# Patient Record
Sex: Male | Born: 1980 | Race: White | Hispanic: No | Marital: Single | Smoking: Former smoker
Health system: Southern US, Community
[De-identification: ages and names within clinical notes are randomized; demographics above are authoritative.]

## PROBLEM LIST (undated history)

## (undated) DIAGNOSIS — Z8619 Personal history of other infectious and parasitic diseases: Secondary | ICD-10-CM

## (undated) HISTORY — DX: Personal history of other infectious and parasitic diseases: Z86.19

---

## 2017-12-26 ENCOUNTER — Ambulatory Visit: Payer: Self-pay | Admitting: Internal Medicine

## 2017-12-26 ENCOUNTER — Encounter: Payer: Self-pay | Admitting: Internal Medicine

## 2017-12-26 VITALS — BP 126/84 | HR 56 | Temp 98.2°F | Ht 71.5 in | Wt 185.0 lb

## 2017-12-26 DIAGNOSIS — Z0000 Encounter for general adult medical examination without abnormal findings: Secondary | ICD-10-CM

## 2017-12-26 DIAGNOSIS — Z113 Encounter for screening for infections with a predominantly sexual mode of transmission: Secondary | ICD-10-CM

## 2017-12-26 LAB — COMPREHENSIVE METABOLIC PANEL
ALBUMIN: 5 g/dL (ref 3.5–5.2)
ALK PHOS: 65 U/L (ref 39–117)
ALT: 21 U/L (ref 0–53)
AST: 20 U/L (ref 0–37)
BUN: 15 mg/dL (ref 6–23)
CO2: 31 mEq/L (ref 19–32)
CREATININE: 0.82 mg/dL (ref 0.40–1.50)
Calcium: 9.9 mg/dL (ref 8.4–10.5)
Chloride: 102 mEq/L (ref 96–112)
GFR: 113.34 mL/min (ref >60.00)
Glucose, Bld: 102 mg/dL — ABNORMAL HIGH (ref 70–99)
POTASSIUM: 4.4 meq/L (ref 3.5–5.1)
SODIUM: 138 meq/L (ref 135–145)
TOTAL PROTEIN: 7.4 g/dL (ref 6.0–8.3)
Total Bilirubin: 0.4 mg/dL (ref 0.2–1.2)

## 2017-12-26 LAB — CBC
HEMATOCRIT: 43.7 % (ref 39.0–52.0)
Hemoglobin: 15.2 g/dL (ref 13.0–17.0)
MCHC: 34.8 g/dL (ref 30.0–36.0)
MCV: 91.5 fl (ref 78.0–100.0)
PLATELETS: 214 10*3/uL (ref 150.0–400.0)
RBC: 4.78 Mil/uL (ref 4.22–5.81)
RDW: 12.5 % (ref 11.5–15.5)
WBC: 5.8 10*3/uL (ref 4.0–10.5)

## 2017-12-26 LAB — LIPID PANEL
CHOLESTEROL: 139 mg/dL (ref 0–200)
HDL: 57.4 mg/dL (ref >39.00)
LDL Cholesterol: 71 mg/dL (ref 0–99)
NonHDL: 81.95
Total CHOL/HDL Ratio: 2
Triglycerides: 54 mg/dL (ref 0.0–149.0)
VLDL: 10.8 mg/dL (ref 0.0–40.0)

## 2017-12-26 NOTE — Patient Instructions (Signed)

## 2017-12-26 NOTE — Progress Notes (Signed)
HPI  Pt presents to the clinic today to establish care. He has not had a PCP in many years.  Flu: never Tetanus: > 10 years ago Dentist: as needed, scheduled in 1 month  Diet: He eats chicken and fish. He consumes fruits and veggies daily. He rarely eats fried foods. He drinks mostly water. Exercise: Cycles 5 days per week  Past Medical History:  Diagnosis Date  . History of chicken pox     Current Outpatient Medications  Medication Sig Dispense Refill  . b complex vitamins tablet Take 1 tablet by mouth daily.    . Creatine POWD Take by mouth.    . Glucosamine 500 MG CAPS Take by mouth.    . Multiple Vitamin (MULTIVITAMIN) tablet Take 1 tablet by mouth daily.    Marland Kitchen. OVER THE COUNTER MEDICATION Take 2 capsules by mouth 2 (two) times daily. Testosterboost    . Protein POWD Take by mouth.     No current facility-administered medications for this visit.     Allergies  Allergen Reactions  . Erythromycin Other (See Comments)    Does not recall was a baby    Family History  Problem Relation Age of Onset  . Supraventricular tachycardia Mother   . Depression Mother   . Heart Problems Father   . Heart disease Father   . Depression Father   . Heart disease Paternal Uncle   . Heart disease Paternal Grandfather   . Heart disease Paternal Uncle   . Heart disease Paternal Uncle   . Heart disease Paternal Uncle     Social History   Socioeconomic History  . Marital status: Single    Spouse name: Not on file  . Number of children: Not on file  . Years of education: Not on file  . Highest education level: Not on file  Occupational History  . Not on file  Social Needs  . Financial resource strain: Not on file  . Food insecurity:    Worry: Not on file    Inability: Not on file  . Transportation needs:    Medical: Not on file    Non-medical: Not on file  Tobacco Use  . Smoking status: Former Smoker    Types: Cigarettes  . Smokeless tobacco: Never Used  . Tobacco comment:  quit 2010  Substance and Sexual Activity  . Alcohol use: Yes    Comment: nightly wine  . Drug use: Yes    Types: Marijuana  . Sexual activity: Not on file  Lifestyle  . Physical activity:    Days per week: Not on file    Minutes per session: Not on file  . Stress: Not on file  Relationships  . Social connections:    Talks on phone: Not on file    Gets together: Not on file    Attends religious service: Not on file    Active member of club or organization: Not on file    Attends meetings of clubs or organizations: Not on file    Relationship status: Not on file  . Intimate partner violence:    Fear of current or ex partner: Not on file    Emotionally abused: Not on file    Physically abused: Not on file    Forced sexual activity: Not on file  Other Topics Concern  . Not on file  Social History Narrative  . Not on file    ROS:  Constitutional: Denies fever, malaise, fatigue, headache or abrupt weight changes.  HEENT: Denies eye pain, eye redness, ear pain, ringing in the ears, wax buildup, runny nose, nasal congestion, bloody nose, or sore throat. Respiratory: Denies difficulty breathing, shortness of breath, cough or sputum production.   Cardiovascular: Denies chest pain, chest tightness, palpitations or swelling in the hands or feet.  Gastrointestinal: Pt reports intermittent constipation. Denies abdominal pain, bloating, diarrhea or blood in the stool.  GU: Denies frequency, urgency, pain with urination, blood in urine, odor or discharge. Musculoskeletal: Pt reports intermittent back and neck pain. Denies decrease in range of motion, difficulty with gait, or joint swelling.  Skin: Denies redness, rashes, lesions or ulcercations.  Neurological: Denies dizziness, difficulty with memory, difficulty with speech or problems with balance and coordination.  Psych: Denies anxiety, depression, SI/HI.  No other specific complaints in a complete review of systems (except as listed  in HPI above).  PE:  BP 126/84   Pulse (!) 56   Temp 98.2 F (36.8 C) (Oral)   Ht 5' 11.5" (1.816 m)   Wt 185 lb (83.9 kg)   SpO2 97%   BMI 25.44 kg/m   Wt Readings from Last 3 Encounters:  12/26/17 185 lb (83.9 kg)    General: Appears his stated age, well developed, well nourished in NAD. HEENT: Head: normal shape and size; Eyes: sclera white, no icterus, conjunctiva pink, PERRLA and EOMs intact; Ears: Tm's gray and intact, normal light reflex, lobes have been stretched;Throat/Mouth: Teeth present, mucosa pink and moist, no lesions or ulcerations noted.  Neck: Neck supple, trachea midline. No masses, lumps or thyromegaly present.  Cardiovascular: Bradycardic with normal rhythm. S1,S2 noted.  No murmur, rubs or gallops noted. No JVD or BLE edema. Pulmonary/Chest: Normal effort and positive vesicular breath sounds. No respiratory distress. No wheezes, rales or ronchi noted.  Abdomen: Soft and nontender. Normal bowel sounds, no bruits noted. No distention or masses noted. Liver, spleen and kidneys non palpable. Musculoskeletal:  Strength 5/5 BUE/BLE. No difficulty with gait.  Neurological: Alert and oriented. Cranial nerves II-XII grossly intact. Coordination normal.  Psychiatric: Mood and affect normal. Behavior is normal. Judgment and thought content normal.     Assessment and Plan:  Preventative Health Maintenance:  Encouraged him to get a flu shot in the fall He declines tetanus booster today Encouraged him to consume a balanced diet and exercise regimen Advised him to see a dentist annually Will check CBC, CMET, Lipid, HIV, RPR and Hep C today  RTC in 1 year, sooner if needed Nicki Reaper, NP

## 2017-12-27 LAB — HEPATITIS C ANTIBODY
Hepatitis C Ab: NONREACTIVE
SIGNAL TO CUT-OFF: 0.01 (ref <1.00)

## 2017-12-27 LAB — HIV ANTIBODY (ROUTINE TESTING W REFLEX): HIV 1&2 Ab, 4th Generation: NONREACTIVE

## 2017-12-27 LAB — RPR: RPR: NONREACTIVE

## 2018-11-09 ENCOUNTER — Ambulatory Visit: Payer: Self-pay | Admitting: Internal Medicine

## 2018-11-09 ENCOUNTER — Telehealth: Payer: Self-pay

## 2018-11-09 ENCOUNTER — Other Ambulatory Visit: Payer: Self-pay

## 2018-11-09 ENCOUNTER — Encounter: Payer: Self-pay | Admitting: Internal Medicine

## 2018-11-09 VITALS — BP 124/76 | HR 77 | Temp 98.5°F | Wt 199.0 lb

## 2018-11-09 DIAGNOSIS — R3915 Urgency of urination: Secondary | ICD-10-CM

## 2018-11-09 DIAGNOSIS — R350 Frequency of micturition: Secondary | ICD-10-CM

## 2018-11-09 LAB — POC URINALSYSI DIPSTICK (AUTOMATED)
Bilirubin, UA: NEGATIVE
Blood, UA: NEGATIVE
Glucose, UA: NEGATIVE
Leukocytes, UA: NEGATIVE
Nitrite, UA: NEGATIVE
Protein, UA: NEGATIVE
Spec Grav, UA: 1.025 (ref 1.010–1.025)
Urobilinogen, UA: 0.2 E.U./dL
pH, UA: 6 (ref 5.0–8.0)

## 2018-11-09 NOTE — Telephone Encounter (Signed)
Burning when pt urinates,frequency of urine with urgency which started 3 days ago. No abd pain today. Feels like irritation in base of urethra; no lower back pain. Pt does not feel warm; thermometer batteries died; 3 days ago one hr before urinate felt burning sensation between groin and belly button; pt said actually in pubic area.no cough,SOB,.chills, S/T,earache,muscle pain,diarrhea,H/a and has not lost since of taste or smell. No travel and no exposure to covid or flu.pt isnot in any distress. Grandfather on fathers side had ? Prostate cancer or just enlarged prostate and pt will speak with father and have infor when Melanie cb. Does pt need in office visit or virtual. Pt request cb by 2 PM cause takes 30" to get to [facility]. ED precautions given.

## 2018-11-09 NOTE — Addendum Note (Signed)
Addended by: Roena Malady on: 11/09/2018 03:49 PM   Modules accepted: Orders

## 2018-11-09 NOTE — Telephone Encounter (Signed)
Pt has appt scheduled today 2:30

## 2018-11-09 NOTE — Progress Notes (Signed)
HPI  Pt presents to the clinic today with c/o urinary symptoms.  Pt presents to the clinic today with c/o urgency and frequency. This started 3 days ago. He denies dysuria, blood in his urine, penile irritation, discharge or testicular pain. He does have an associated burning sensation in his lower abdomen/groin area and feeling the need to urinate. He denies fever, chills, nausea or low back pain. He is sexually active in an open relationship. His last sexual partner outside of his wife was 7 months ago. He has increased his water intake and tried Cranberry juice with some relief.  Review of Systems  Past Medical History:  Diagnosis Date  . History of chicken pox     Family History  Problem Relation Age of Onset  . Supraventricular tachycardia Mother   . Depression Mother   . Heart Problems Father   . Heart disease Father   . Depression Father   . Heart disease Paternal Uncle   . Heart disease Paternal Grandfather   . Heart disease Paternal Uncle   . Heart disease Paternal Uncle   . Heart disease Paternal Uncle     Social History   Socioeconomic History  . Marital status: Single    Spouse name: Not on file  . Number of children: Not on file  . Years of education: Not on file  . Highest education level: Not on file  Occupational History  . Not on file  Social Needs  . Financial resource strain: Not on file  . Food insecurity:    Worry: Not on file    Inability: Not on file  . Transportation needs:    Medical: Not on file    Non-medical: Not on file  Tobacco Use  . Smoking status: Former Smoker    Types: Cigarettes  . Smokeless tobacco: Never Used  . Tobacco comment: quit 2010  Substance and Sexual Activity  . Alcohol use: Yes    Comment: nightly wine  . Drug use: Yes    Types: Marijuana  . Sexual activity: Not on file  Lifestyle  . Physical activity:    Days per week: Not on file    Minutes per session: Not on file  . Stress: Not on file  Relationships  .  Social connections:    Talks on phone: Not on file    Gets together: Not on file    Attends religious service: Not on file    Active member of club or organization: Not on file    Attends meetings of clubs or organizations: Not on file    Relationship status: Not on file  . Intimate partner violence:    Fear of current or ex partner: Not on file    Emotionally abused: Not on file    Physically abused: Not on file    Forced sexual activity: Not on file  Other Topics Concern  . Not on file  Social History Narrative  . Not on file    Allergies  Allergen Reactions  . Erythromycin Other (See Comments)    Does not recall was a baby     Constitutional: Denies fever, malaise, fatigue, headache or abrupt weight changes.   GU: Pt reports urgency, frequency. Denies dysuria, burning sensation, blood in urine, odor or discharge. Skin: Denies redness, rashes, lesions or ulcercations.   No other specific complaints in a complete review of systems (except as listed in HPI above).    Objective:   Physical Exam  BP 124/76  Pulse 77   Temp 98.5 F (36.9 C) (Oral)   Wt 199 lb (90.3 kg)   SpO2 99%   BMI 27.37 kg/m   Wt Readings from Last 3 Encounters:  11/09/18 199 lb (90.3 kg)  12/26/17 185 lb (83.9 kg)    General: Appears his stated age, well developed, well nourished in NAD. Cardiovascular: Normal rate and rhythm. S1,S2 noted.   Pulmonary/Chest: Normal effort and positive vesicular breath sounds. No respiratory distress. No wheezes, rales or ronchi noted.  Abdomen: Soft. Normal bowel sounds. No distention or masses noted.  Mildly tender to palpation over the bladder area. No inguinal hernia. No CVA tenderness. Rectal: refused.        Assessment & Plan:   Urgency, Frequency:  Urinalysis: +/- ketones Will send urine culture Will obtain urine, gonorrhea and chalmydia Drink plenty of fluids Continue cranberry juice- will follow up once results are back  RTC as needed or  if symptoms persist. Nicki Reaper, NP

## 2018-11-10 LAB — URINE CULTURE
MICRO NUMBER:: 454131
Result:: NO GROWTH
SPECIMEN QUALITY:: ADEQUATE

## 2018-11-10 LAB — C. TRACHOMATIS/N. GONORRHOEAE RNA
C. trachomatis RNA, TMA: NOT DETECTED
N. gonorrhoeae RNA, TMA: NOT DETECTED

## 2018-12-27 ENCOUNTER — Encounter: Payer: Self-pay | Admitting: Internal Medicine

## 2018-12-27 ENCOUNTER — Telehealth: Payer: Self-pay | Admitting: Internal Medicine

## 2018-12-27 ENCOUNTER — Ambulatory Visit (INDEPENDENT_AMBULATORY_CARE_PROVIDER_SITE_OTHER): Payer: Self-pay | Admitting: Internal Medicine

## 2018-12-27 DIAGNOSIS — R1030 Lower abdominal pain, unspecified: Secondary | ICD-10-CM

## 2018-12-27 DIAGNOSIS — R300 Dysuria: Secondary | ICD-10-CM

## 2018-12-27 DIAGNOSIS — N4889 Other specified disorders of penis: Secondary | ICD-10-CM

## 2018-12-27 DIAGNOSIS — Z113 Encounter for screening for infections with a predominantly sexual mode of transmission: Secondary | ICD-10-CM

## 2018-12-27 NOTE — Telephone Encounter (Signed)
Pt reports Sx from 11/09/2018 did improvement x 2-3 days Sx seemed to have returned, urethral pain, shaft of penis sharp pain at times and tenderness in groin area.   Pt has Doxy set up this afternoon as he can come tomorrow for specimen collection etc if needed... denies any other Sx and would like to be screened again for STDs as he has a new partner

## 2018-12-27 NOTE — Telephone Encounter (Signed)
Patient stated he had test done last month and did not receive a call with the results  Patient's C/b # [phone]

## 2018-12-27 NOTE — Telephone Encounter (Signed)
Patient also requested an STD panel be done since he has been with a new partner. He stated the symptoms from last test are still there, that is why is called about his results.

## 2018-12-27 NOTE — Progress Notes (Signed)
Virtual Visit via Video Note  I connected with Greg Larson on 12/27/18 at  2:15 PM EDT by a video enabled telemedicine application and verified that I am speaking with the correct person using two identifiers.  Location: Patient: Home Provider: Office   I discussed the limitations of evaluation and management by telemedicine and the availability of in person appointments. The patient expressed understanding and agreed to proceed.  History of Present Illness:  Pt reports penile pain, dysuria and groin pain. He reports this started about 2 months ago. He was seen 5/7 for the same. STD screening at that time was negative. Urinalysis and culture were negative. He reports his pain did improve since then but worsened 2 days ago. He describes the pain as being in the midshaft. He describes the pain as sharp. The pain does not radiate. He denies urgency, frequency, penile discharge, abnormal bleeding. He denies testicular pain or swelling. He describes the groin pain as sore and achy. The groin pain is worse with movement. He is sexually active. He would like to be re screened for STD's.     Past Medical History:  Diagnosis Date  . History of chicken pox     Current Outpatient Medications  Medication Sig Dispense Refill  . b complex vitamins tablet Take 1 tablet by mouth daily.    . Creatine POWD Take by mouth.    . Glucosamine 500 MG CAPS Take by mouth.    . Multiple Vitamin (MULTIVITAMIN) tablet Take 1 tablet by mouth daily.    Marland Kitchen. OVER THE COUNTER MEDICATION Take 2 capsules by mouth 2 (two) times daily. Testosterboost    . Protein POWD Take by mouth.     No current facility-administered medications for this visit.     Allergies  Allergen Reactions  . Erythromycin Other (See Comments)    Does not recall was a baby    Family History  Problem Relation Age of Onset  . Supraventricular tachycardia Mother   . Depression Mother   . Heart Problems Father   . Heart disease Father    . Depression Father   . Heart disease Paternal Uncle   . Heart disease Paternal Grandfather   . Heart disease Paternal Uncle   . Heart disease Paternal Uncle   . Heart disease Paternal Uncle     Social History   Socioeconomic History  . Marital status: Single    Spouse name: Not on file  . Number of children: Not on file  . Years of education: Not on file  . Highest education level: Not on file  Occupational History  . Not on file  Social Needs  . Financial resource strain: Not on file  . Food insecurity    Worry: Not on file    Inability: Not on file  . Transportation needs    Medical: Not on file    Non-medical: Not on file  Tobacco Use  . Smoking status: Former Smoker    Types: Cigarettes  . Smokeless tobacco: Never Used  . Tobacco comment: quit 2010  Substance and Sexual Activity  . Alcohol use: Yes    Comment: nightly wine  . Drug use: Yes    Types: Marijuana  . Sexual activity: Not on file  Lifestyle  . Physical activity    Days per week: Not on file    Minutes per session: Not on file  . Stress: Not on file  Relationships  . Social Musicianconnections    Talks on phone: Not  on file    Gets together: Not on file    Attends religious service: Not on file    Active member of club or organization: Not on file    Attends meetings of clubs or organizations: Not on file    Relationship status: Not on file  . Intimate partner violence    Fear of current or ex partner: Not on file    Emotionally abused: Not on file    Physically abused: Not on file    Forced sexual activity: Not on file  Other Topics Concern  . Not on file  Social History Narrative  . Not on file     Constitutional: Denies fever, malaise, fatigue, headache or abrupt weight changes.  Respiratory: Denies difficulty breathing, shortness of breath, cough or sputum production.   Cardiovascular: Denies chest pain, chest tightness, palpitations or swelling in the hands or feet.  Gastrointestinal:  Denies abdominal pain, bloating, constipation, diarrhea or blood in the stool.  GU: Pt reports penile pain, dysuria. Denies urgency, frequency, burning sensation, blood in urine, odor or discharge. Musculoskeletal: Pt reports groin pain. Denies decrease in range of motion, difficulty with gait, or joint pain and swelling.  Skin: Denies redness, rashes, lesions or ulcercations.    No other specific complaints in a complete review of systems (except as listed in HPI above).   Wt Readings from Last 3 Encounters:  11/09/18 199 lb (90.3 kg)  12/26/17 185 lb (83.9 kg)    General: Appears his stated age, well developed, well nourished in NAD. Skin: Warm, dry and intact. No rashes, lesions or ulcerations noted. Pulmonary/Chest: Normal effort. No respiratory distress.  Abdomen: Soft and nontender. Normal bowel sounds. No distention or masses noted. Liver, spleen and kidneys non palpable. Musculoskeletal: Normal range of motion. No signs of joint swelling. No difficulty with gait.  Neurological: Alert and oriented.   BMET    Component Value Date/Time   NA 138 12/26/2017 1218   K 4.4 12/26/2017 1218   CL 102 12/26/2017 1218   CO2 31 12/26/2017 1218   GLUCOSE 102 (H) 12/26/2017 1218   BUN 15 12/26/2017 1218   CREATININE 0.82 12/26/2017 1218   CALCIUM 9.9 12/26/2017 1218    Lipid Panel     Component Value Date/Time   CHOL 139 12/26/2017 1218   TRIG 54.0 12/26/2017 1218   HDL 57.40 12/26/2017 1218   CHOLHDL 2 12/26/2017 1218   VLDL 10.8 12/26/2017 1218   LDLCALC 71 12/26/2017 1218    CBC    Component Value Date/Time   WBC 5.8 12/26/2017 1218   RBC 4.78 12/26/2017 1218   HGB 15.2 12/26/2017 1218   HCT 43.7 12/26/2017 1218   PLT 214.0 12/26/2017 1218   MCV 91.5 12/26/2017 1218   MCHC 34.8 12/26/2017 1218   RDW 12.5 12/26/2017 1218    Hgb A1C No results found for: HGBA1C      Assessment and Plan:  Dysuria, Penile Pain, Groin Pain, Screen for STD:  Will have him  make lab only appt for urine gonorrhea, chlamydia and trich Will check HIV, RPR, HSV 1&2, Hep C Consider CT renal stone study to assess for bladder stones vs referral to urology for possible cystoscopy  Will follow up after labs, return precautions discussed  Follow Up Instructions:    I discussed the assessment and treatment plan with the patient. The patient was provided an opportunity to ask questions and all were answered. The patient agreed with the plan and demonstrated an understanding  of the instructions.   The patient was advised to call back or seek an in-person evaluation if the symptoms worsen or if the condition fails to improve as anticipated.   Nicki Reaperegina Gayla Benn, NP

## 2018-12-27 NOTE — Telephone Encounter (Signed)
Will discuss at upcoming appt.

## 2018-12-27 NOTE — Patient Instructions (Signed)

## 2018-12-28 ENCOUNTER — Other Ambulatory Visit (INDEPENDENT_AMBULATORY_CARE_PROVIDER_SITE_OTHER): Payer: Self-pay

## 2018-12-28 DIAGNOSIS — R300 Dysuria: Secondary | ICD-10-CM

## 2018-12-28 DIAGNOSIS — N4889 Other specified disorders of penis: Secondary | ICD-10-CM

## 2018-12-28 DIAGNOSIS — Z113 Encounter for screening for infections with a predominantly sexual mode of transmission: Secondary | ICD-10-CM

## 2018-12-29 ENCOUNTER — Encounter: Payer: Self-pay | Admitting: Internal Medicine

## 2018-12-29 ENCOUNTER — Telehealth: Payer: Self-pay | Admitting: Internal Medicine

## 2018-12-29 NOTE — Telephone Encounter (Signed)
Patient called back about his results he received on his my chart, He has a few questions that he would like to speak to a nurse about    Patient's C/B #  [phone]

## 2018-12-30 ENCOUNTER — Ambulatory Visit (INDEPENDENT_AMBULATORY_CARE_PROVIDER_SITE_OTHER): Payer: Self-pay | Admitting: Family Medicine

## 2018-12-30 ENCOUNTER — Encounter: Payer: Self-pay | Admitting: Family Medicine

## 2018-12-30 ENCOUNTER — Other Ambulatory Visit: Payer: Self-pay

## 2018-12-30 DIAGNOSIS — R591 Generalized enlarged lymph nodes: Secondary | ICD-10-CM | POA: Insufficient documentation

## 2018-12-30 MED ORDER — CEFIXIME 400 MG PO CAPS: 400.0000 mg | ORAL_CAPSULE | Freq: Every day | ORAL | 0 refills | Status: AC

## 2018-12-30 MED ORDER — AZITHROMYCIN 1 G PO PACK: 1.0000 g | PACK | Freq: Once | ORAL | 0 refills | Status: AC

## 2018-12-30 NOTE — Assessment & Plan Note (Signed)
New R groin LAD associated with several days of GU symptoms (dysuria, clear discharge) and setting of new sexual partner. STD testing was overall reassuring, UCx normal - reviewed with patient. However given ongoing symptoms reasonable to treat for chlamydia/gonorrhea with azithromycin slurry and suprax 1 dose. If trouble affording suprax, would have him come into office for rocephin 250mg  IM x1.

## 2018-12-30 NOTE — Progress Notes (Signed)
Virtual visit completed through [employer]. Due to national recommendations of social distancing due to COVID-19, a virtual visit is felt to be most appropriate for this patient at this time. Reviewed limitations of a virtual visit.   Patient location: home Provider location: [place] at [facility], office If any vitals were documented, they were collected by patient at home unless specified below.    There were no vitals taken for this visit.   CC: dysuria, lump in groin Subjective:    Patient ID: Greg Larson, male    DOB: 02-04-1981, 38 y.o.   MRN: [id number]  HPI: Greg Larson is a 38 y.o. male presenting on 12/30/2018 for Mass (Right Groin Area. Just appeared today. Has been seeing Nicki Reaper for ongoing dysuria.)   5 wks ago started noticing abnormal sensation in groin above base of penis. This persisted for 4 days. STD screen at that time including UA returned normal. Symptoms did resolve for 4 wks, now over the past week symptoms have recurred and worsened (5 days ago) - sharp discomfort with dysuria at penis. Possible clear urethral discharge.   This morning noticed painful sensitive lump in crease where R leg meets groin. No warmth or redness. Yesterday started taking cranberry juice and increasing pedialyte with benefit.   No fevers/chills, abd pain, hematuria, frequency or urgency, flank pain, nausea/vomiting, no rash. No scrotal or testicular mass noted.  No other lumps or swollen glands elsewhere.  H/o chlamydia at a young age, treated.  Works in [employer]. fmhx prostate cancer (grandfather).  Currently sexually active in the past year, 2 partners in the last year - one new in the past month.      Relevant past medical, surgical, family and social history reviewed and updated as indicated. Interim medical history since our last visit reviewed. Allergies and medications reviewed and updated. Outpatient Medications Prior to Visit  Medication Sig Dispense  Refill  . b complex vitamins tablet Take 1 tablet by mouth daily.    . Creatine POWD Take by mouth.    . Glucosamine 500 MG CAPS Take by mouth.    . Multiple Vitamin (MULTIVITAMIN) tablet Take 1 tablet by mouth daily.    Marland Kitchen OVER THE COUNTER MEDICATION Take 2 capsules by mouth 2 (two) times daily. Testosterboost    . Protein POWD Take by mouth.     No facility-administered medications prior to visit.      Per HPI unless specifically indicated in ROS section below Review of Systems Objective:    There were no vitals taken for this visit.  Wt Readings from Last 3 Encounters:  11/09/18 199 lb (90.3 kg)  12/26/17 185 lb (83.9 kg)     Physical exam: Gen: alert, NAD, not ill appearing Pulm: speaks in complete sentences without increased work of breathing Psych: normal mood, normal thought content  GU: viewed groin crease through video - no obvious swelling, no erythema     Results for orders placed or performed in visit on 12/28/18  Urine Culture   Specimen: Blood  Result Value Ref Range   MICRO NUMBER: [id number]    SPECIMEN QUALITY: Adequate    Sample Source URINE    STATUS: FINAL    Result: No Growth   C. trachomatis/N. gonorrhoeae RNA   Specimen: Blood  Result Value Ref Range   C. trachomatis RNA, TMA NOT DETECTED NOT DETECT   N. gonorrhoeae RNA, TMA NOT DETECTED NOT DETECT  HSV(herpes simplex vrs) 1+2 ab-IgG  Result Value Ref Range  HAV 1 IGG,TYPE SPECIFIC AB >58.00 (H) index   HSV 2 IGG,TYPE SPECIFIC AB <0.90 index  Hepatitis C antibody  Result Value Ref Range   Hepatitis C Ab NON-REACTIVE NON-REACTI   SIGNAL TO CUT-OFF 0.02 <1.00  RPR  Result Value Ref Range   RPR Ser Ql NON-REACTIVE NON-REACTI  HIV Antibody (routine testing w rflx)  Result Value Ref Range   HIV 1&2 Ab, 4th Generation NON-REACTIVE NON-REACTI  Trichomonas vaginalis RNA, Ql,Males  Result Value Ref Range   Trichomonas vaginalis RNA NOT DETECTED NOT DETECT   Assessment & Plan:   Problem List  Items Addressed This Visit    Lymphadenopathy    New R groin LAD associated with several days of GU symptoms (dysuria, clear discharge) and setting of new sexual partner. STD testing was overall reassuring, UCx normal - reviewed with patient. However given ongoing symptoms reasonable to treat for chlamydia/gonorrhea with azithromycin slurry and suprax 1 dose. If trouble affording suprax, would have him come into office for rocephin 250mg  IM x1.           Meds ordered this encounter  Medications  . azithromycin (ZITHROMAX) 1 g powder    Sig: Take 1 packet (1 g total) by mouth once for 1 dose.    Dispense:  1 each    Refill:  0  . cefixime (SUPRAX) 400 MG CAPS capsule    Sig: Take 1 capsule (400 mg total) by mouth daily.    Dispense:  1 capsule    Refill:  0   No orders of the defined types were placed in this encounter.   I discussed the assessment and treatment plan with the patient. The patient was provided an opportunity to ask questions and all were answered. The patient agreed with the plan and demonstrated an understanding of the instructions. The patient was advised to call back or seek an in-person evaluation if the symptoms worsen or if the condition fails to improve as anticipated.  Follow up plan: Return if symptoms worsen or fail to improve.  Eustaquio Boyden, MD

## 2019-01-01 ENCOUNTER — Telehealth: Payer: Self-pay

## 2019-01-01 LAB — HEPATITIS C ANTIBODY
Hepatitis C Ab: NONREACTIVE
SIGNAL TO CUT-OFF: 0.02 (ref <1.00)

## 2019-01-01 LAB — URINE CULTURE
MICRO NUMBER:: 607538
Result:: NO GROWTH
SPECIMEN QUALITY:: ADEQUATE

## 2019-01-01 LAB — HSV(HERPES SIMPLEX VRS) I + II AB-IGG
HAV 1 IGG,TYPE SPECIFIC AB: >58 index — ABNORMAL HIGH
HSV 2 IGG,TYPE SPECIFIC AB: <0.9 index

## 2019-01-01 LAB — RPR: RPR Ser Ql: NONREACTIVE

## 2019-01-01 LAB — C. TRACHOMATIS/N. GONORRHOEAE RNA
C. trachomatis RNA, TMA: NOT DETECTED
N. gonorrhoeae RNA, TMA: NOT DETECTED

## 2019-01-01 LAB — HSV 1/2 AB (IGM), IFA W/RFLX TITER
HSV 1 IgM Screen: NEGATIVE
HSV 2 IgM Screen: NEGATIVE

## 2019-01-01 LAB — TRICHOMONAS VAGINALIS RNA, QL,MALES: Trichomonas vaginalis RNA: NOT DETECTED

## 2019-01-01 LAB — HIV ANTIBODY (ROUTINE TESTING W REFLEX): HIV 1&2 Ab, 4th Generation: NONREACTIVE

## 2019-01-01 NOTE — Telephone Encounter (Signed)
Per chart review pt was seen at [facility]. Avie Echevaria NP not in office; will send to Dr Darnell Level. I spoke with Judson Roch at [facility] and theAzithromycin 1 gm packet was switched to 500 mg x 2 for dosing and the Suprax was sent to another pharmacy and nothing further needed for pharmacy.

## 2019-01-01 NOTE — Telephone Encounter (Signed)
Noted. Sounds like he received abx from another pharmacy.

## 2019-01-01 NOTE — Telephone Encounter (Signed)
[facility] Night - Client TELEPHONE ADVICE RECORD [facility] Patient Name: Greg Larson Gender: Male DOB: [birth year] Age: 38 Y 5 M 21 D Return Phone Number: [phone] (Primary) Address: City/State/Zip: [place] [address] Client [facility] Night - Client Client [facility] - Night Physician Nicki Reaper - NP Contact Type Call Who Is Calling Patient / Member / Family / Caregiver Call Type Triage / Clinical Relationship To Patient Self Return Phone Number [phone] (Primary) Chief Complaint Skin Lesion - Moles/ Lumps/ Growths Reason for Call Symptomatic / Request for Health Information Initial Comment Caller states that he is currently experiencing irritating when he urinates. There is a lump that has come up the groin area. Translation No Nurse Assessment Nurse: Fredric Mare, RN, Christy Date/Time [name] Time): 12/30/2018 11:32:27 AM Confirm and document reason for call. If symptomatic, describe symptoms. ---Caller states that he is having irritation when he urinates and has a lump in right side of groin. This is a new symptom. Has the patient had close contact with a person known or suspected to have the novel coronavirus illness OR traveled / lives in area with major community spread (including international travel) in the last 14 days from the onset of symptoms? * If Asymptomatic, screen for exposure and travel within the last 14 days. ---No Does the patient have any new or worsening symptoms? ---Yes Will a triage be completed? ---Yes Related visit to physician within the last 2 weeks? ---Yes Does the PT have any chronic conditions? (i.e. diabetes, asthma, this includes High risk factors for pregnancy, etc.) ---No Is this a behavioral health or substance abuse call? ---No Guidelines Guideline Title Affirmed Question Affirmed Notes Nurse Date/Time (Eastern Time) Scrotum Swelling Patient  sounds very sick or weak to the triager Fredric Mare, RN, [facility] 12/30/2018 11:35:10 AM Disp. Time [name] Time) Disposition Final User 12/30/2018 11:39:14 AM Go to ED Now (or PCP triage) Yes Fredric Mare, RN, Christy PLEASE NOTE: All timestamps contained within this report are represented as [place] Standard Time. CONFIDENTIALTY NOTICE: This fax transmission is intended only for the addressee. It contains information that is legally privileged, confidential or otherwise protected from use or disclosure. If you are not the intended recipient, you are strictly prohibited from reviewing, disclosing, copying using or disseminating any of this information or taking any action in reliance on or regarding this information. If you have received this fax in error, please notify us immediately by telephone so that we can arrange for its return to [place]. Phone: [phone], Toll-Free: [phone], Fax: [phone] Page: 2 of 2 Call Id: [id number] Caller Disagree/Comply Comply Caller Understands Yes PreDisposition Go to Urgent Care/Walk-In Clinic Care Advice Given Per Guideline GO TO ED NOW (OR PCP TRIAGE): * IF PCP SECOND-LEVEL TRIAGE REQUIRED: You may need to be seen. Your doctor (or NP/PA) will want to talk with you to decide what's best. I'll page the provider on-call now. If you haven't heard from the provider (or me) within 30 minutes, go directly to the ED/UCC at _____________ Hospital. BRING MEDICINES: * Please bring a list of your current medicines when you go to see the doctor. * It is also a good idea to bring the pill bottles too. This will help the doctor to make certain you are taking the right medicines and the right dose. 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 Condition needed to be addressed today since this was a new untreated  symptom. Referrals [facility]

## 2019-01-01 NOTE — Telephone Encounter (Signed)
[facility] Night - Client TELEPHONE ADVICE RECORD [facility] Patient Name: Greg Larson Gender: Male DOB: 05/07/1981 Age: 38 Y 28 M 21 D Return Phone Number: [phone] (Primary) Address: City/State/Zip: [place]  [address] Client [facility] Night - Client Client Site [place] Physician Ria Bush - MD Contact Type Call Who Is Calling Pharmacy Call Type Pharmacy Send to RN Chief Complaint Paging or Request for Consult Reason for Call Request to change medication order Initial Comment Caller states he is calling from [facility]. He got a prescription and does not have any of the antibiotics that were submitted. Additional Comment Pharmacy Name [facility] Pharmacist Name Philippi Number [phone] Translation No Nurse Assessment Guidelines Guideline Title Affirmed Question Affirmed Notes Nurse Date/Time Eilene Ghazi Time) Disp. Time Eilene Ghazi Time) Disposition Final User 12/30/2018 7:10:18 PM Pharmacy Call Garnetta Buddy, RN, [place] Reason: Pharmacy closed and no voicemail available 12/30/2018 7:10:58 PM FINAL ATTEMPT MADE - no message left Yes Garnetta Buddy, RN, [facility]

## 2019-01-02 ENCOUNTER — Encounter (HOSPITAL_COMMUNITY): Payer: Self-pay | Admitting: Emergency Medicine

## 2019-01-02 ENCOUNTER — Emergency Department (HOSPITAL_COMMUNITY)
Admission: EM | Admit: 2019-01-02 | Discharge: 2019-01-02 | Disposition: A | Discharge status: Home / Self Care | Payer: Self-pay | Attending: Emergency Medicine | Admitting: Emergency Medicine

## 2019-01-02 ENCOUNTER — Other Ambulatory Visit: Payer: Self-pay

## 2019-01-02 ENCOUNTER — Emergency Department (HOSPITAL_COMMUNITY): Payer: Self-pay

## 2019-01-02 DIAGNOSIS — N201 Calculus of ureter: Secondary | ICD-10-CM | POA: Insufficient documentation

## 2019-01-02 DIAGNOSIS — Z87891 Personal history of nicotine dependence: Secondary | ICD-10-CM | POA: Insufficient documentation

## 2019-01-02 DIAGNOSIS — Z79899 Other long term (current) drug therapy: Secondary | ICD-10-CM | POA: Insufficient documentation

## 2019-01-02 LAB — BASIC METABOLIC PANEL
Anion gap: 9 (ref 5–15)
BUN: 14 mg/dL (ref 6–20)
CO2: 26 mmol/L (ref 22–32)
Calcium: 9.5 mg/dL (ref 8.9–10.3)
Chloride: 104 mmol/L (ref 98–111)
Creatinine, Ser: 0.91 mg/dL (ref 0.61–1.24)
GFR calc Af Amer: >60 mL/min (ref >60)
GFR calc non Af Amer: >60 mL/min (ref >60)
Glucose, Bld: 89 mg/dL (ref 70–99)
Potassium: 4.2 mmol/L (ref 3.5–5.1)
Sodium: 139 mmol/L (ref 135–145)

## 2019-01-02 LAB — URINALYSIS, ROUTINE W REFLEX MICROSCOPIC
Bilirubin Urine: NEGATIVE
Glucose, UA: NEGATIVE mg/dL
Hgb urine dipstick: NEGATIVE
Ketones, ur: NEGATIVE mg/dL
Leukocytes,Ua: NEGATIVE
Nitrite: NEGATIVE
Protein, ur: NEGATIVE mg/dL
Specific Gravity, Urine: 1.02 (ref 1.005–1.030)
pH: 7 (ref 5.0–8.0)

## 2019-01-02 LAB — CBC
HCT: 44.8 % (ref 39.0–52.0)
Hemoglobin: 15.1 g/dL (ref 13.0–17.0)
MCH: 31.1 pg (ref 26.0–34.0)
MCHC: 33.7 g/dL (ref 30.0–36.0)
MCV: 92.4 fL (ref 80.0–100.0)
Platelets: 205 10*3/uL (ref 150–400)
RBC: 4.85 MIL/uL (ref 4.22–5.81)
RDW: 11.7 % (ref 11.5–15.5)
WBC: 5.2 10*3/uL (ref 4.0–10.5)
nRBC: 0 % (ref 0.0–0.2)

## 2019-01-02 MED ORDER — IOHEXOL 300 MG/ML  SOLN
100.0000 mL | Freq: Once | INTRAMUSCULAR | Status: AC | PRN
  Administered 2019-01-02: 100 mL via INTRAVENOUS

## 2019-01-02 MED ORDER — TAMSULOSIN HCL 0.4 MG PO CAPS: 0.4000 mg | ORAL_CAPSULE | Freq: Every day | ORAL | 0 refills | Status: AC

## 2019-01-02 NOTE — ED Provider Notes (Signed)
[facility] EMERGENCY DEPARTMENT Provider Note   CSN: [id number] Arrival date & time: 01/02/19  1423    History   Chief Complaint Chief Complaint  Patient presents with   Dysuria    HPI Greg Larson is a 38 y.o. male.     HPI   Greg Larson is a 38 y.o. male, patient with no pertinent past medical history, presenting to the ED with dysuria for last 4-5 weeks. Has had multiple UAs without noted abnormalities.  Pain has been worsening for last 4 days. Pain is sharp and burning at the base of the penis and suprapubic regions, 8/10, nonradiating.  He has noted some difficulty starting a urinary stream. Some clear penile discharge. Sexually active with two male partners. Due to persistent symptoms, patient was treated with 1 g azithromycin and 400mg  cefixime on 6/27, though STI testing performed 6/25 was all negative. He notes some concerns since his paternal grandfather was diagnosed with prostate cancer before age 11. Denies fever/chills, N/V/D, shortness of breath, chest pain, difficulty urinating, pain with bowel movements, flank/back pain, hematuria, or any other complaints.    Past Medical History:  Diagnosis Date   History of chicken pox     Patient Active Problem List   Diagnosis Date Noted   Lymphadenopathy 12/30/2018    History reviewed. No pertinent surgical history.      Home Medications    Prior to Admission medications   Medication Sig Start Date End Date Taking? Authorizing Provider  b complex vitamins tablet Take 1 tablet by mouth daily.    [provider]  cefixime (SUPRAX) 400 MG CAPS capsule Take 1 capsule (400 mg total) by mouth daily. 12/30/18   Ria Bush, MD  Creatine POWD Take by mouth.    [provider]  Glucosamine 500 MG CAPS Take by mouth.    [provider]  Multiple Vitamin (MULTIVITAMIN) tablet Take 1 tablet by mouth daily.    [provider]  OVER THE COUNTER  MEDICATION Take 2 capsules by mouth 2 (two) times daily. Testosterboost    [provider]  Protein POWD Take by mouth.    [provider]  tamsulosin (FLOMAX) 0.4 MG CAPS capsule Take 1 capsule (0.4 mg total) by mouth daily. 01/02/19 02/01/19  Annaliza Zia, Helane Gunther, PA-C    Family History Family History  Problem Relation Age of Onset   Supraventricular tachycardia Mother    Depression Mother    Heart Problems Father    Heart disease Father    Depression Father    Heart disease Paternal Uncle    Heart disease Paternal Grandfather    Heart disease Paternal Uncle    Heart disease Paternal Uncle    Heart disease Paternal Uncle     Social History Social History   Tobacco Use   Smoking status: Former Smoker    Types: Cigarettes   Smokeless tobacco: Never Used   Tobacco comment: quit 2010  Substance Use Topics   Alcohol use: Yes    Comment: nightly wine   Drug use: Yes    Types: Marijuana     Allergies   Erythromycin   Review of Systems Review of Systems  Constitutional: Negative for chills and fever.  Respiratory: Negative for shortness of breath.   Cardiovascular: Negative for chest pain.  Gastrointestinal: Positive for abdominal pain. Negative for diarrhea, nausea and vomiting.  Genitourinary: Positive for discharge and dysuria. Negative for decreased urine volume, difficulty urinating, flank pain, hematuria, scrotal swelling and  testicular pain.  Musculoskeletal: Negative for back pain.  All other systems reviewed and are negative.    Physical Exam Updated Vital Signs BP (!) 168/83 (BP Location: Right Arm)    Pulse 78    Temp 98.8 F (37.1 C) (Oral)    Resp 18    Ht 5\' 11"  (1.803 m)    Wt 88.5 kg    SpO2 100%    BMI 27.20 kg/m   Physical Exam Vitals signs and nursing note reviewed.  Constitutional:      General: He is not in acute distress.    Appearance: He is well-developed. He is not diaphoretic.     Comments: Appears comfortable  and relaxed lying in the bed.  HENT:     Head: Normocephalic and atraumatic.     Mouth/Throat:     Mouth: Mucous membranes are moist.     Pharynx: Oropharynx is clear.  Eyes:     Conjunctiva/sclera: Conjunctivae normal.  Neck:     Musculoskeletal: Neck supple.  Cardiovascular:     Rate and Rhythm: Normal rate and regular rhythm.     Pulses: Normal pulses.          Radial pulses are 2+ on the right side and 2+ on the left side.       Posterior tibial pulses are 2+ on the right side and 2+ on the left side.     Heart sounds: Normal heart sounds.     Comments: Tactile temperature in the extremities appropriate and equal bilaterally. Pulmonary:     Effort: Pulmonary effort is normal. No respiratory distress.     Breath sounds: Normal breath sounds.  Abdominal:     Palpations: Abdomen is soft.     Tenderness: There is no abdominal tenderness. There is no guarding.     Comments: No noted abdominal tenderness.  Genitourinary:    Comments: Genital Exam: Penis, scrotum, and testicles without swelling, lesions, or tenderness. No penile discharge.  No perineal tenderness or fullness. Right testicle is higher than the left, however, patient states this is normal for him. Cremasteric reflex intact.   Right singular inguinal lymphadenopathy.  No noted hernia. Otherwise normal male genitalia.   Rectal Exam:  No external hemorrhoids, fissures, or lesions noted.  No frank blood or melena. No stool burden.  No rectal tenderness. No foreign bodies noted.   No noted prostatic enlargement, bogginess, or tenderness.  PA student, Max, served as chaperone during the rectal exam. Musculoskeletal:     Right lower leg: No edema.     Left lower leg: No edema.  Lymphadenopathy:     Cervical: No cervical adenopathy.     Lower Body: Right inguinal adenopathy present.     Comments: I am able to identify one prominent, tender right inguinal lymph node  Skin:    General: Skin is warm and dry.    Neurological:     Mental Status: He is alert.  Psychiatric:        Mood and Affect: Mood and affect normal.        Speech: Speech normal.        Behavior: Behavior normal.      ED Treatments / Results  Labs (all labs ordered are listed, but only abnormal results are displayed) Labs Reviewed  URINALYSIS, ROUTINE W REFLEX MICROSCOPIC - Abnormal; Notable for the following components:      Result Value   APPearance CLOUDY (*)    All other components within normal limits  URINE  CULTURE  BASIC METABOLIC PANEL  CBC    EKG None  Radiology Ct Abdomen Pelvis W Contrast  Result Date: 01/02/2019 CLINICAL DATA:  38 year old male with lower abdominal pain, difficulty urinating. EXAM: CT ABDOMEN AND PELVIS WITH CONTRAST TECHNIQUE: Multidetector CT imaging of the abdomen and pelvis was performed using the standard protocol following bolus administration of intravenous contrast. CONTRAST:  100mL OMNIPAQUE IOHEXOL 300 MG/ML  SOLN COMPARISON:  None. FINDINGS: Lower chest: Negative. No pericardial or pleural effusion. Hepatobiliary: Negative liver and gallbladder. Pancreas: Negative. Spleen: There is a subtle hypodense area in the central spleen on series 3, image 23. Splenic size and enhancement is otherwise normal. This is most likely a benign lesion such as hemangioma. Adrenals/Urinary Tract: Normal adrenal glands. There is a small simple fluid density right renal midpole cyst. Left nephrolithiasis at the midpole measures 5 millimeters. No other nephrolithiasis identified. No perinephric stranding. No hydronephrosis. Proximal ureters are decompressed. However, there is a 3-4 millimeter calculus along the course of the distal left ureter just proximal to the UVJ which appears to be within the ureter on coronal image 61. This is fiber 10 millimeters proximal to the bladder which otherwise appears normal. The distal right ureter appears negative. Stomach/Bowel: Decompressed and negative descending and  rectosigmoid colon. Negative transverse and right colon aside from retained stool. Negative terminal ileum. Appendix is at the upper limits of normal to mildly enlarged, but does not appear inflamed on coronal image 39 and series 3, image 71. No appendicoliths identified. No dilated small bowel. Negative stomach. No free air, free fluid. Vascular/Lymphatic: Major arterial structures are patent. Portal venous system appears to be patent. No lymphadenopathy. Reproductive: Negative. Other: No pelvic free fluid. Musculoskeletal: Negative. IMPRESSION: 1. Positive for a 3-4 mm pelvic calcification suspected to be a stone in the distal left ureter about 1 cm proximal to the UVJ. There are no secondary signs of left renal obstruction, but there is an additional 5 millimeter left renal midpole calculus. 2. The appendix is at the upper limits of normal to mildly enlarged but does not appear inflamed. If there is right rather than left lower quadrant pain then consider a repeat CT Abdomen and Pelvis with oral contrast in 12-24 hours. Electronically Signed   By: Odessa FlemingH  Hall M.D.   On: 01/02/2019 20:43    Procedures Procedures (including critical care time)  Medications Ordered in ED Medications  iohexol (OMNIPAQUE) 300 MG/ML solution 100 mL (100 mLs Intravenous Contrast Given 01/02/19 2012)     Initial Impression / Assessment and Plan / ED Course  I have reviewed the triage vital signs and the nursing notes.  Pertinent labs & imaging results that were available during my care of the patient were reviewed by me and considered in my medical decision making (see chart for details).  Clinical Course as of Jan 01 2149  Tue Jan 02, 2019  2130 Discussed imaging results.  Patient's pain resolved and has not recurred.   [SJ]    Clinical Course User Index [SJ] Conswella Bruney C, PA-C       Patient presents with some urinary symptoms as well as suprapubic abdominal pain. Patient is nontoxic appearing, afebrile, not  tachycardic, not tachypneic, not hypotensive, maintains excellent SPO2 on room air, and is in no apparent distress.  UA and other lab work overall reassuring.  CT with left ureteral calculus near the UVJ.  This is suspected to be what is causing the patient's pain. There is also a comment regarding patient's  appendix at the upper end of normal, but without inflammation.  Patient does not have tenderness in the right lower quadrant and I would expect based on the patient's timeline of symptoms, whether we use the 4 to 5 weeks or 4 to 5 days, that patient would be much more ill, febrile, and have a leukocytosis.  I would also expect more definitive findings in the appendix on CT.  Regardless, I discussed this finding with the patient as well as strict return precautions. Patient voices understanding of these instructions, accepts the plan, and is comfortable with discharge.    Findings and plan of care discussed with Erasmo ScoreMike Butler, MD.    Vitals:   01/02/19 1930 01/02/19 1936 01/02/19 1945 01/02/19 2000  BP: 132/82 137/83 133/82 131/83  Pulse: (!) 57 74 60 (!) 58  Resp:  14    Temp:      TempSrc:      SpO2: 98% 98% 96% 96%  Weight:      Height:         Final Clinical Impressions(s) / ED Diagnoses   Final diagnoses:  Ureterolithiasis    ED Discharge Orders         Ordered    tamsulosin (FLOMAX) 0.4 MG CAPS capsule  Daily     01/02/19 2142           Concepcion LivingJoy, Addisson Frate C, PA-C 01/02/19 2155    Terrilee FilesButler, Michael C, MD 01/03/19 [phone]

## 2019-01-02 NOTE — ED Notes (Signed)
Post void residual is 0 ml

## 2019-01-02 NOTE — Discharge Instructions (Signed)
°  Kidney Stone There is evidence of a kidney stone on the left side.  It appears as though it is on its way out.  Some kidney stones can take up to 30 days to pass. Hydration: Hydration is key to helping a kidney stone pass.  Have a goal of half a liter of water every hour or two. Antiinflammatory medications: Take 600 mg of ibuprofen every 6 hours or 440 mg (over the counter dose) to 500 mg (prescription dose) of naproxen every 12 hours for the next 3 days. After this time, these medications may be used as needed for pain. Take these medications with food to avoid upset stomach. Choose only one of these medications, do not take them together. Acetaminophen: Should you continue to have additional pain while taking the ibuprofen or naproxen, you may add in acetaminophen (generic for Tylenol) as needed. Your daily total maximum amount of acetaminophen from all sources should be limited to 4000mg /day for persons without liver problems, or 2000mg /day for those with liver problems. Tamsulosin: This medication is designed to help the stone pass.  Take this medication daily until stone passes. Follow-up: Follow-up with the urologist as soon as possible on this matter. Return: Return to the ED for significantly increased pain, difficulty urinating, pain with urination, fever, uncontrolled vomiting, or any other major concerns.  For prescription assistance, may try using prescription discount sites or apps, such as goodrx.com  The CT scan also showed an appendix that was at the upper limit of normal in size without signs of inflammation.  Return to the ED for increased abdominal pain, especially in the lower right region, fever, vomiting, or any other major concerns.

## 2019-01-02 NOTE — ED Triage Notes (Signed)
Pt states he has been having painful urination for 5 weeks, but has worsened over the past two days. Difficulty urinating as well. Pt was tested for UTI by primary MD, and did not find that he had one. Pt developed swollen lymph nodes in his right groin on sat morning. His MD prescribed him antibiotics. Pt concerned he has a kidney stone.

## 2019-01-03 LAB — URINE CULTURE: Culture: NO GROWTH

## 2019-08-16 IMAGING — CT CT ABDOMEN AND PELVIS WITH CONTRAST
2 of 4 series · 15 of 46 positions shown, 17 images · IV contrast (APPLIED)
Comparison: None.

CLINICAL DATA: 38-year-old male with lower abdominal pain,
difficulty urinating.

EXAM:
CT ABDOMEN AND PELVIS WITH CONTRAST
TECHNIQUE: Multidetector CT imaging of the abdomen and pelvis was performed
using the standard protocol following bolus administration of
intravenous contrast.
CONTRAST:  100mL OMNIPAQUE IOHEXOL 300 MG/ML  SOLN

[Series 3: abdomen 5.0 · axial · 0.74mm/px · z∈[+724,+1148]mm · 12 of 97 slices shown, 14 images]
[im 6/97  soft-tissue]
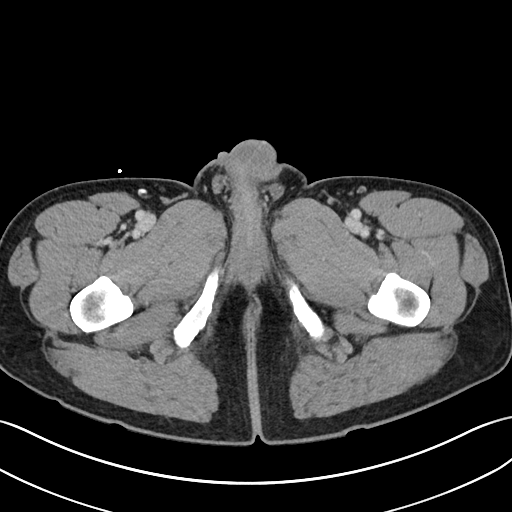
[im 6/97  bone]
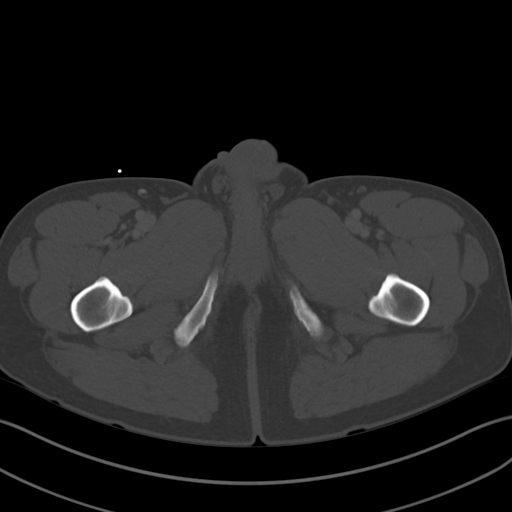
[im 17/97  soft-tissue]
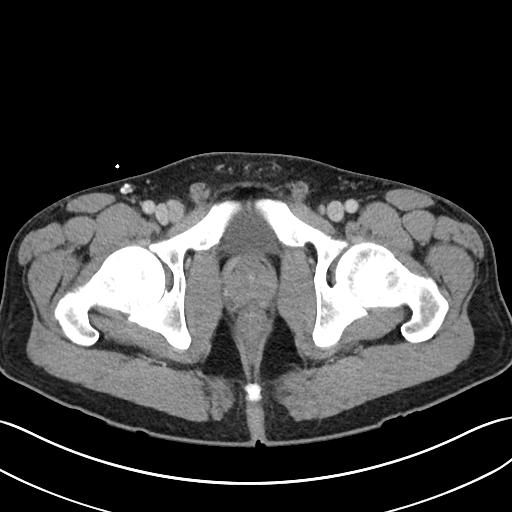
[im 22/97  soft-tissue]
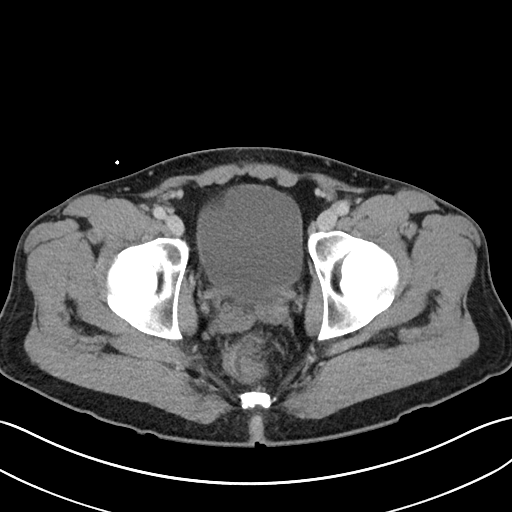
[im 27/97  soft-tissue]
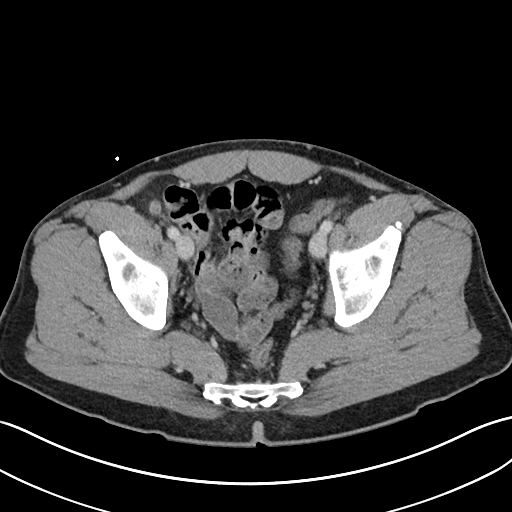
[im 38/97  soft-tissue]
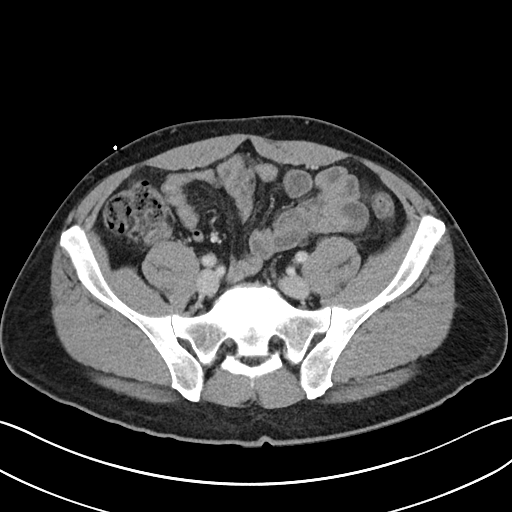
[im 43/97  soft-tissue]
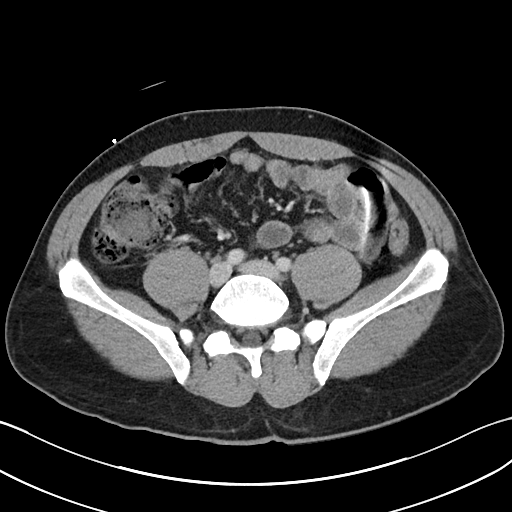
[im 54/97  soft-tissue]
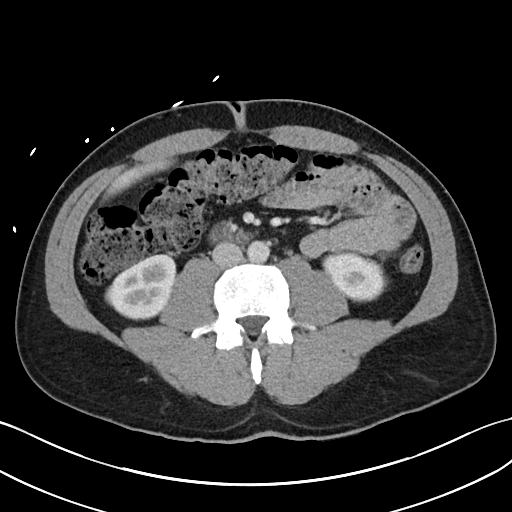
[im 59/97  soft-tissue]
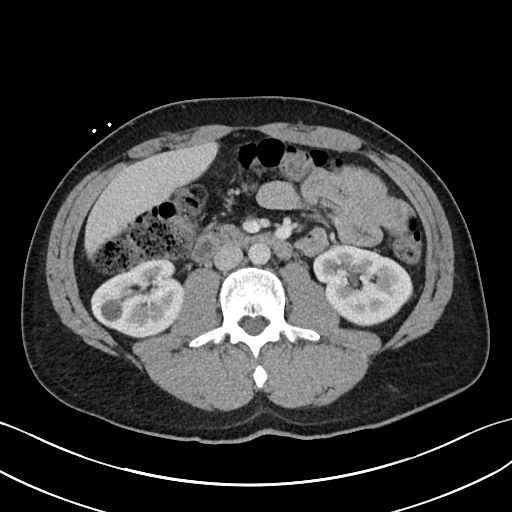
[im 70/97  soft-tissue]
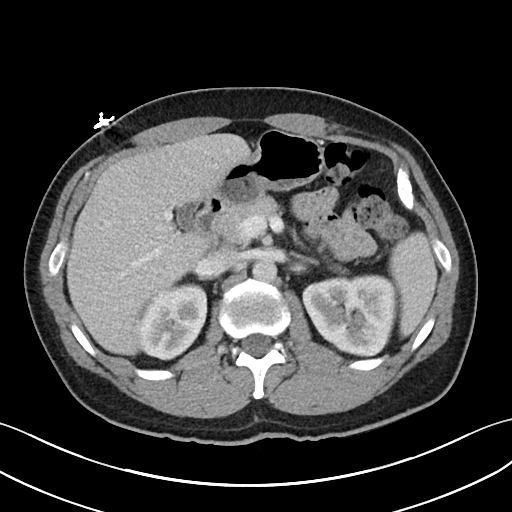
[im 70/97  bone]
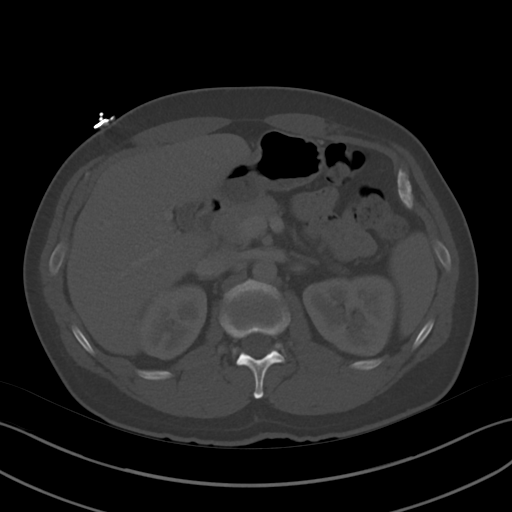
[im 75/97  soft-tissue]
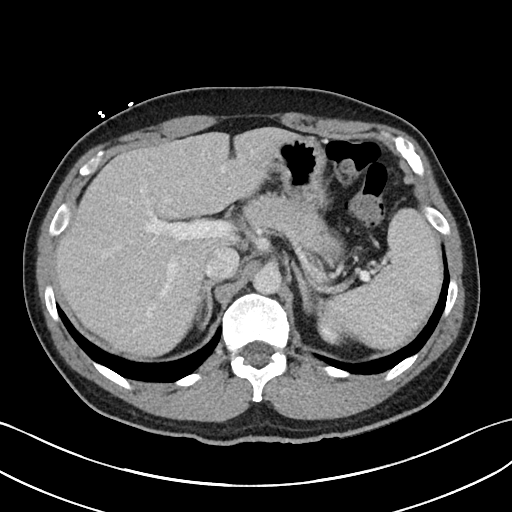
[im 81/97  soft-tissue]
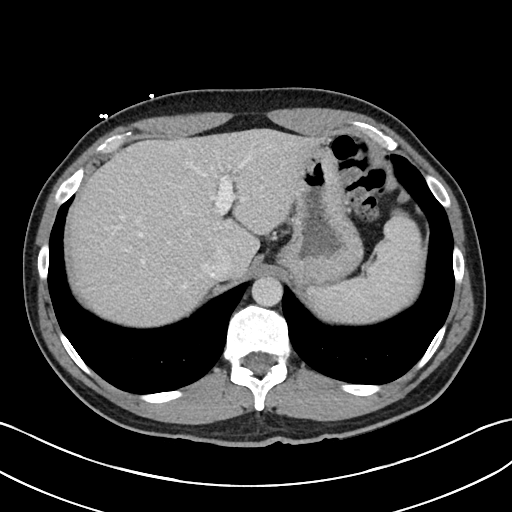
[im 91/97  soft-tissue]
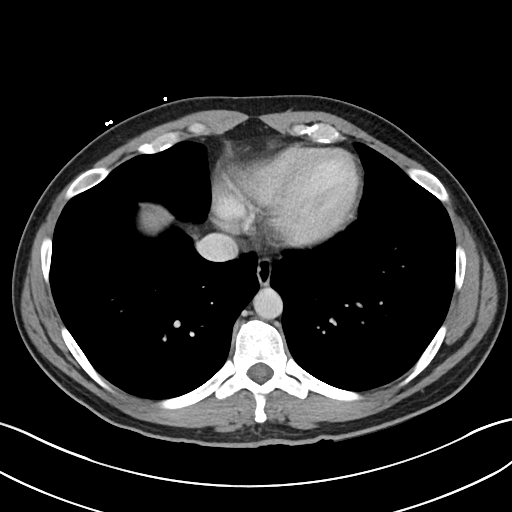

[Series 6: abdomen 3.0 mpr cor · coronal · 0.77mm/px · 3 of 100 slices shown]
[im 34/100  soft-tissue]
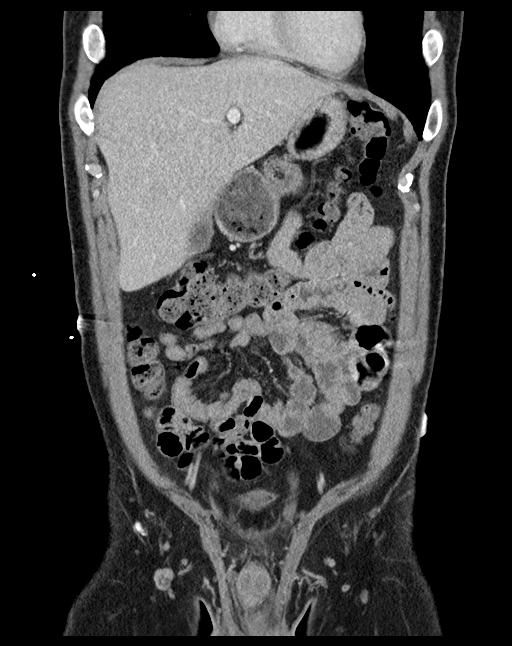
[im 45/100  soft-tissue]
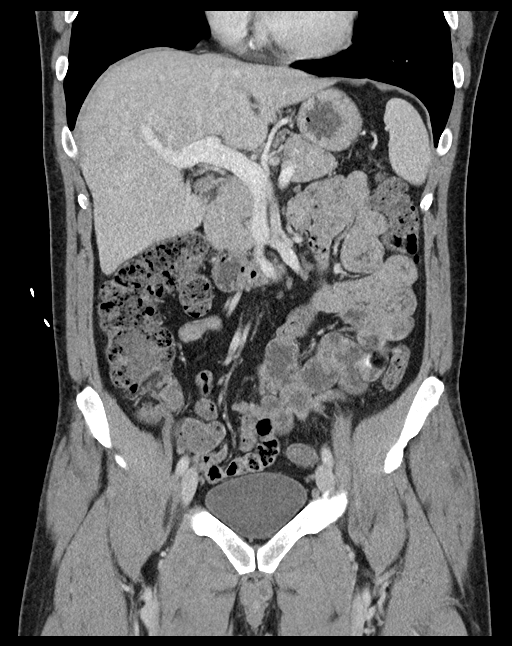
[im 56/100  soft-tissue]
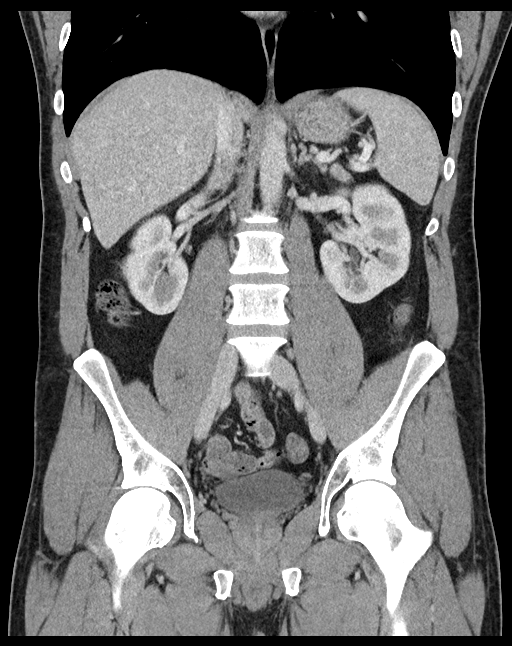

[15 of 46 positions shown; findings below may reference images not displayed]

FINDINGS: Lower chest: Negative. No pericardial or pleural effusion.

Hepatobiliary: Negative liver and gallbladder.

Pancreas: Negative.

Spleen: There is a subtle hypodense area in the central spleen on
series 3, image 23. Splenic size and enhancement is otherwise
normal. This is most likely a benign lesion such as hemangioma.

Adrenals/Urinary Tract: Normal adrenal glands.

There is a small simple fluid density right renal midpole cyst. Left
nephrolithiasis at the midpole measures 5 millimeters. No other
nephrolithiasis identified. No perinephric stranding. No
hydronephrosis. Proximal ureters are decompressed.

However, there is a 3-4 millimeter calculus along the course of the
distal left ureter just proximal to the UVJ which appears to be
within the ureter on coronal image 61. This is fiber 10 millimeters
proximal to the bladder which otherwise appears normal. The distal
right ureter appears negative.

Stomach/Bowel: Decompressed and negative descending and rectosigmoid
colon. Negative transverse and right colon aside from retained
stool. Negative terminal ileum. Appendix is at the upper limits of
normal to mildly enlarged, but does not appear inflamed on coronal
image 39 and series 3, image 71. No appendicoliths identified.

No dilated small bowel. Negative stomach. No free air, free fluid.

Vascular/Lymphatic: Major arterial structures are patent. Portal
venous system appears to be patent.

No lymphadenopathy.

Reproductive: Negative.

Other: No pelvic free fluid.

Musculoskeletal: Negative.
IMPRESSION: 1. Positive for a 3-4 mm pelvic calcification suspected to be a
stone in the distal left ureter about 1 cm proximal to the UVJ.
There are no secondary signs of left renal obstruction, but there is
an additional 5 millimeter left renal midpole calculus.
2. The appendix is at the upper limits of normal to mildly enlarged
but does not appear inflamed. If there is right rather than left
lower quadrant pain then consider a repeat CT Abdomen and Pelvis
with oral contrast in 12-24 hours.

## 2023-04-24 ENCOUNTER — Encounter (HOSPITAL_BASED_OUTPATIENT_CLINIC_OR_DEPARTMENT_OTHER): Payer: Self-pay

## 2023-04-24 ENCOUNTER — Other Ambulatory Visit: Payer: Self-pay

## 2023-04-24 ENCOUNTER — Emergency Department (HOSPITAL_BASED_OUTPATIENT_CLINIC_OR_DEPARTMENT_OTHER): Payer: Self-pay

## 2023-04-24 ENCOUNTER — Emergency Department (HOSPITAL_BASED_OUTPATIENT_CLINIC_OR_DEPARTMENT_OTHER)
Admission: EM | Admit: 2023-04-24 | Discharge: 2023-04-24 | Disposition: A | Discharge status: Home / Self Care | Payer: Self-pay | Attending: Emergency Medicine | Admitting: Emergency Medicine

## 2023-04-24 DIAGNOSIS — N200 Calculus of kidney: Secondary | ICD-10-CM

## 2023-04-24 DIAGNOSIS — N132 Hydronephrosis with renal and ureteral calculous obstruction: Secondary | ICD-10-CM | POA: Insufficient documentation

## 2023-04-24 LAB — CBC
HCT: 45.6 % (ref 39.0–52.0)
Hemoglobin: 16 g/dL (ref 13.0–17.0)
MCH: 31.4 pg (ref 26.0–34.0)
MCHC: 35.1 g/dL (ref 30.0–36.0)
MCV: 89.6 fL (ref 80.0–100.0)
Platelets: 247 10*3/uL (ref 150–400)
RBC: 5.09 MIL/uL (ref 4.22–5.81)
RDW: 11.9 % (ref 11.5–15.5)
WBC: 7.7 10*3/uL (ref 4.0–10.5)
nRBC: 0 % (ref 0.0–0.2)

## 2023-04-24 LAB — BASIC METABOLIC PANEL
Anion gap: 6 (ref 5–15)
BUN: 17 mg/dL (ref 6–20)
CO2: 29 mmol/L (ref 22–32)
Calcium: 10.1 mg/dL (ref 8.9–10.3)
Chloride: 102 mmol/L (ref 98–111)
Creatinine, Ser: 1.04 mg/dL (ref 0.61–1.24)
GFR, Estimated: >60 mL/min (ref >60)
Glucose, Bld: 123 mg/dL — ABNORMAL HIGH (ref 70–99)
Potassium: 4.3 mmol/L (ref 3.5–5.1)
Sodium: 137 mmol/L (ref 135–145)

## 2023-04-24 LAB — URINALYSIS, ROUTINE W REFLEX MICROSCOPIC
Bacteria, UA: NONE SEEN
Bilirubin Urine: NEGATIVE
Glucose, UA: NEGATIVE mg/dL
Ketones, ur: NEGATIVE mg/dL
Leukocytes,Ua: NEGATIVE
Nitrite: NEGATIVE
Protein, ur: 30 mg/dL — AB
RBC / HPF: >50 RBC/hpf (ref 0–5)
Specific Gravity, Urine: 1.03 (ref 1.005–1.030)
pH: 7 (ref 5.0–8.0)

## 2023-04-24 MED ORDER — TAMSULOSIN HCL 0.4 MG PO CAPS: 0.4000 mg | ORAL_CAPSULE | Freq: Every day | ORAL | 0 refills | Status: AC

## 2023-04-24 MED ORDER — OXYCODONE HCL 5 MG PO TABS: 5.0000 mg | ORAL_TABLET | ORAL | 0 refills | Status: AC | PRN

## 2023-04-24 MED ORDER — HYDROMORPHONE HCL 1 MG/ML IJ SOLN
1.0000 mg | Freq: Once | INTRAMUSCULAR | Status: AC
  Administered 2023-04-24: 1 mg via INTRAVENOUS
  Filled 2023-04-24: qty 1

## 2023-04-24 MED ORDER — KETOROLAC TROMETHAMINE 15 MG/ML IJ SOLN
15.0000 mg | Freq: Once | INTRAMUSCULAR | Status: AC
  Administered 2023-04-24: 15 mg via INTRAVENOUS
  Filled 2023-04-24: qty 1

## 2023-04-24 MED ORDER — MORPHINE SULFATE (PF) 4 MG/ML IV SOLN
4.0000 mg | Freq: Once | INTRAVENOUS | Status: AC
  Administered 2023-04-24: 4 mg via INTRAVENOUS
  Filled 2023-04-24: qty 1

## 2023-04-24 MED ORDER — ONDANSETRON HCL 4 MG/2ML IJ SOLN
4.0000 mg | Freq: Once | INTRAMUSCULAR | Status: AC
  Administered 2023-04-24: 4 mg via INTRAVENOUS
  Filled 2023-04-24: qty 2

## 2023-04-24 MED ORDER — ONDANSETRON 4 MG PO TBDP: 4.0000 mg | ORAL_TABLET | Freq: Three times a day (TID) | ORAL | 0 refills | Status: AC | PRN

## 2023-04-24 NOTE — Discharge Instructions (Signed)
Your pain improved after medications in the emergency department.  You have 2 stones in the left ureter.  Largest is 5 mm.  You may potentially pass this on your own.  Follow-up with the urologist.  Drink plenty of fluids.  You can take 1000 mg of Tylenol every 6 hours, 600 mg of ibuprofen on an empty stomach or 800 mg of ibuprofen on a full stomach every 6-8 hours for pain control.  Reserve the pain medication for severe breakthrough pain.  I recommend that for the next 2 to 3 days that you take Zofran as scheduled in case you need pain medication of your pain gets significantly worse that you also do not have to worry about nausea and keeping the medications down.  If you have any concerning symptoms return to the emergency room.

## 2023-04-24 NOTE — ED Notes (Signed)
Patient transported to CT 

## 2023-04-24 NOTE — ED Notes (Signed)
Patient given discharge instructions. Questions were answered. Patient verbalized understanding of discharge instructions and care at home.  Discharged with friend  

## 2023-04-24 NOTE — ED Notes (Signed)
Patient back from CT.

## 2023-04-24 NOTE — ED Provider Notes (Signed)
[place] [facility]MENT AT [facility] Provider Note   CSN: [id number] Arrival date & time: 04/24/23  4132     History  Chief Complaint  Patient presents with   Flank Pain    Greg Larson is a 42 y.o. male.  42 year old male presents today for concern of left flank pain.  Started this morning.  Denies hematuria.  Endorses nausea and vomiting but no other associated symptoms.  Reports 4 episodes of emesis.  Nonbloody and nonbilious.  Has history of kidney stones.  Previous kidney stones were asymptomatic.  The history is provided by the patient. No language interpreter was used.       Home Medications Prior to Admission medications   Medication Sig Start Date End Date Taking? Authorizing Provider  b complex vitamins tablet Take 1 tablet by mouth daily.    [provider]  cefixime (SUPRAX) 400 MG CAPS capsule Take 1 capsule (400 mg total) by mouth daily. 12/30/18   Eustaquio Boyden, MD  Creatine POWD Take by mouth.    [provider]  Glucosamine 500 MG CAPS Take by mouth.    [provider]  Multiple Vitamin (MULTIVITAMIN) tablet Take 1 tablet by mouth daily.    [provider]  OVER THE COUNTER MEDICATION Take 2 capsules by mouth 2 (two) times daily. Testosterboost    [provider]  Protein POWD Take by mouth.    [provider]      Allergies    Erythromycin    Review of Systems   Review of Systems  Constitutional:  Negative for chills and fever.  Gastrointestinal:  Positive for nausea and vomiting. Negative for abdominal pain.  Genitourinary:  Positive for flank pain. Negative for dysuria and hematuria.  Neurological:  Negative for light-headedness.  All other systems reviewed and are negative.   Physical Exam Updated Vital Signs BP 125/74   Pulse (!) 54   Temp (!) 96.6 F (35.9 C)   Resp 20   Ht 5' 11.5" (1.816 m)   Wt 99.8 kg   SpO2 100%   BMI 30.26 kg/m  Physical  Exam Vitals and nursing note reviewed.  Constitutional:      General: He is not in acute distress.    Appearance: Normal appearance. He is not ill-appearing.  HENT:     Head: Normocephalic and atraumatic.     Nose: Nose normal.  Eyes:     Conjunctiva/sclera: Conjunctivae normal.  Cardiovascular:     Rate and Rhythm: Normal rate and regular rhythm.  Pulmonary:     Effort: Pulmonary effort is normal. No respiratory distress.  Abdominal:     General: There is no distension.     Palpations: Abdomen is soft.     Tenderness: There is no abdominal tenderness. There is left CVA tenderness. There is no right CVA tenderness or guarding.  Musculoskeletal:        General: No deformity. Normal range of motion.     Cervical back: Normal range of motion.  Skin:    Findings: No rash.  Neurological:     Mental Status: He is alert.     ED Results / Procedures / Treatments   Labs (all labs ordered are listed, but only abnormal results are displayed) Labs Reviewed  URINALYSIS, ROUTINE W REFLEX MICROSCOPIC - Abnormal; Notable for the following components:      Result Value   APPearance HAZY (*)    Hgb urine dipstick LARGE (*)    Protein, ur 30 (*)  All other components within normal limits  BASIC METABOLIC PANEL - Abnormal; Notable for the following components:   Glucose, Bld 123 (*)    All other components within normal limits  CBC    EKG None  Radiology No results found.  Procedures Procedures    Medications Ordered in ED Medications  HYDROmorphone (DILAUDID) injection 1 mg (1 mg Intravenous Given 04/24/23 1107)  ondansetron (ZOFRAN) injection 4 mg (4 mg Intravenous Given 04/24/23 1107)  morphine (PF) 4 MG/ML injection 4 mg (4 mg Intravenous Given 04/24/23 1131)    ED Course/ Medical Decision Making/ A&P                                 Medical Decision Making Amount and/or Complexity of Data Reviewed Labs: ordered. Radiology: ordered.  Risk Prescription drug  management.   Medical Decision Making / ED Course   This patient presents to the ED for concern of left flank pain, this involves an extensive number of treatment options, and is a complaint that carries with it a high risk of complications and morbidity.  The differential diagnosis includes pyelonephritis, nephrolithiasis, UTI, diverticulitis, pancreatitis  MDM: 41 year old male with past medical history significant for kidney stones presents today for concern of left flank pain that started this morning.  Reports severe pain.  Appears uncomfortable on exam.  Will order pain control and obtain CT imaging.  Hemoglobin noted on UA.  No evidence of UTI.  CBC unremarkable.  BMP with preserved renal function and normal electrolytes.  Pain improved after 2 rounds of IV pain medication.  CT renal stone study reveals 2 adjacent stones in the left ureter with mild hydronephrosis.  Again renal function is preserved.  He is without pain currently.  Will give shot of Toradol.  Symptomatic management discussed.  Urology referral given.  Return precautions discussed.  Patient and partner voiced understanding and are in agreement with plan.  Lab Tests: -I ordered, reviewed, and interpreted labs.   The pertinent results include:   Labs Reviewed  URINALYSIS, ROUTINE W REFLEX MICROSCOPIC - Abnormal; Notable for the following components:      Result Value   APPearance HAZY (*)    Hgb urine dipstick LARGE (*)    Protein, ur 30 (*)    All other components within normal limits  BASIC METABOLIC PANEL - Abnormal; Notable for the following components:   Glucose, Bld 123 (*)    All other components within normal limits  CBC      EKG  EKG Interpretation Date/Time:    Ventricular Rate:    PR Interval:    QRS Duration:    QT Interval:    QTC Calculation:   R Axis:      Text Interpretation:           Imaging Studies ordered: I ordered imaging studies including CT renal stone study I independently  visualized and interpreted imaging. I agree with the radiologist interpretation   Medicines ordered and prescription drug management: Meds ordered this encounter  Medications   HYDROmorphone (DILAUDID) injection 1 mg   ondansetron (ZOFRAN) injection 4 mg   morphine (PF) 4 MG/ML injection 4 mg    -I have reviewed the patients home medicines and have made adjustments as needed  Reevaluation: After the interventions noted above, I reevaluated the patient and found that they have :improved  Co morbidities that complicate the patient evaluation  Past Medical History:  Diagnosis Date   History of chicken pox       Dispostion: Patient is stable for discharge.  Discharged in stable condition.  Strict return precaution discussed.  No evidence of septic stone.  Urology referral given.   Final Clinical Impression(s) / ED Diagnoses Final diagnoses:  Nephrolithiasis    Rx / DC Orders ED Discharge Orders          Ordered    oxyCODONE (ROXICODONE) 5 MG immediate release tablet  Every 4 hours PRN        04/24/23 1422    tamsulosin (FLOMAX) 0.4 MG CAPS capsule  Daily        04/24/23 1422    ondansetron (ZOFRAN-ODT) 4 MG disintegrating tablet  Every 8 hours PRN        04/24/23 1422              Marita [place], PA-C 04/24/23 1422    Alvira Monday, MD 04/25/23 2231

## 2023-04-24 NOTE — ED Triage Notes (Signed)
Pt arrived POV, caox4, ambulatory c/o intermittent L flank pain since this morning. Pt also c/o N/V when pain gets severe. Pt denies pain on urination or any other urinary s/s. Pt denies fever but has had chills with pain. Pt had episode in triage with onset of increased pain that he got diaphoretic, dizzy, and pale. Pt was laid back in triage chair and s/s improved, pt states that happened earlier at home when pain was severe as well.

## 2023-04-27 ENCOUNTER — Ambulatory Visit: Payer: Self-pay

## 2023-04-27 NOTE — Telephone Encounter (Signed)
Message from Greg Larson sent at 04/27/2023  4:39 PM EDT  Pt is calling in because last Sunday pt was in the ER for kidney stoned. Pt says he hasn't had a bowel movement since Saturday and pt's nausea is bad and he can't keep food down, pt tried a suppositary laxative about 30 minutes ago and no results as of yet.   Chief Complaint: severe vomiting Symptoms: bladder pressure and burning with urination, intermittent severe abd pain, becomes pale and diaphoretic when vomiting and having pain episodes, dry mouth Frequency: Sunday  Pertinent Negatives: Patient denies fever, headache, dizziness Disposition: [x] ED /[] Urgent Care (no appt availability in office) / [] Appointment(In office/virtual)/ []  [facility]/ [] Home Care/ [] Refused Recommended Disposition /[] [facility]/ []  Follow-up with PCP Additional Notes: pt concerned he is constipated and knows pain meds can slow down motility and has been taking stool softeners. Advised pt that he probably doesn't have much stool in his bowel because of vomiting. Pt going to ER at [facility].  Reason for Disposition  [1] SEVERE vomiting (e.g., 6 or more times/day) AND [2] present > 8 hours (Exception: Patient sounds well, is drinking liquids, does not sound dehydrated, and vomiting has lasted less than 24 hours.)  Answer Assessment - Initial Assessment Questions 1. VOMITING SEVERITY: "How many times have you vomited in the past 24 hours?"     - MILD:  1 - 2 times/day    - MODERATE: 3 - 5 times/day, decreased oral intake without significant weight loss or symptoms of dehydration    - SEVERE: 6 or more times/day, vomits everything or nearly everything, with significant weight loss, symptoms of dehydration      severe 2. ONSET: "When did the vomiting begin?"      Sunday  3. FLUIDS: "What fluids or food have you vomited up today?" "Have you been able to keep any fluids down?"     2 cups of water but threw it up.  4. ABDOMEN PAIN: "Are  your having any abdomen pain?" If Yes : "How bad is it and what does it feel like?" (e.g., crampy, dull, intermittent, constant) no abd pain      Flank pain last night comes and gies can go as high as 7/10 5. DIARRHEA: "Is there any diarrhea?" If Yes, ask: "How many times today?"      no 6. CONTACTS: "Is there anyone else in the family with the same symptoms?"      N/a 7. CAUSE: "What do you think is causing your vomiting?"     Unsure  8. HYDRATION STATUS: "Any signs of dehydration?" (e.g., dry mouth [not only dry lips], too weak to stand) "When did you last urinate?"     Dry mouth last urination 20 minutes  9. OTHER SYMPTOMS: "Do you have any other symptoms?" (e.g., fever, headache, vertigo, vomiting blood or coffee grounds, recent head injury)     Pale swaet with pain and vomited 10. PREGNANCY: "Is there any chance you are pregnant?" "When was your last menstrual period?"       na  Protocols used: Vomiting-A-AH
